# Patient Record
Sex: Female | Born: 2000 | Race: White | Hispanic: No | Marital: Single | Smoking: Never smoker
Health system: Southern US, Community
[De-identification: ages and names within clinical notes are randomized; demographics above are authoritative.]

## PROBLEM LIST (undated history)

## (undated) DIAGNOSIS — M419 Scoliosis, unspecified: Secondary | ICD-10-CM

## (undated) HISTORY — PX: TONSILLECTOMY: SUR1361

---
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
